# Patient Record
Sex: Female | Born: 1990 | Hispanic: No | Marital: Single | Smoking: Former smoker
Health system: Southern US, Community
[De-identification: ages and names within clinical notes are randomized; demographics above are authoritative.]

## PROBLEM LIST (undated history)

## (undated) DIAGNOSIS — F419 Anxiety disorder, unspecified: Secondary | ICD-10-CM

## (undated) HISTORY — PX: TONSILLECTOMY: SUR1361

---
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
## 2015-10-26 ENCOUNTER — Emergency Department (HOSPITAL_COMMUNITY)
Admission: EM | Admit: 2015-10-26 | Discharge: 2015-10-26 | Disposition: A | Discharge status: Other Acute Inpatient Hospital | Payer: Self-pay

## 2015-10-26 ENCOUNTER — Emergency Department (HOSPITAL_COMMUNITY)
Admission: EM | Admit: 2015-10-26 | Discharge: 2015-10-26 | Disposition: A | Discharge status: Home / Self Care | Payer: BC Managed Care – PPO | Attending: Emergency Medicine | Admitting: Emergency Medicine

## 2015-10-26 ENCOUNTER — Emergency Department (HOSPITAL_COMMUNITY): Payer: BC Managed Care – PPO

## 2015-10-26 ENCOUNTER — Encounter (HOSPITAL_COMMUNITY): Payer: Self-pay | Admitting: Family Medicine

## 2015-10-26 DIAGNOSIS — E663 Overweight: Secondary | ICD-10-CM | POA: Insufficient documentation

## 2015-10-26 DIAGNOSIS — Z880 Allergy status to penicillin: Secondary | ICD-10-CM | POA: Insufficient documentation

## 2015-10-26 DIAGNOSIS — F419 Anxiety disorder, unspecified: Secondary | ICD-10-CM | POA: Insufficient documentation

## 2015-10-26 DIAGNOSIS — Z3202 Encounter for pregnancy test, result negative: Secondary | ICD-10-CM | POA: Insufficient documentation

## 2015-10-26 DIAGNOSIS — R079 Chest pain, unspecified: Secondary | ICD-10-CM | POA: Diagnosis not present

## 2015-10-26 DIAGNOSIS — R0602 Shortness of breath: Secondary | ICD-10-CM | POA: Diagnosis present

## 2015-10-26 LAB — CBC
HCT: 36.5 % (ref 36.0–46.0)
Hemoglobin: 11.6 g/dL — ABNORMAL LOW (ref 12.0–15.0)
MCH: 25.4 pg — AB (ref 26.0–34.0)
MCHC: 31.8 g/dL (ref 30.0–36.0)
MCV: 79.9 fL (ref 78.0–100.0)
PLATELETS: 367 10*3/uL (ref 150–400)
RBC: 4.57 MIL/uL (ref 3.87–5.11)
RDW: 14.9 % (ref 11.5–15.5)
WBC: 12 10*3/uL — ABNORMAL HIGH (ref 4.0–10.5)

## 2015-10-26 LAB — BASIC METABOLIC PANEL
Anion gap: 17 — ABNORMAL HIGH (ref 5–15)
BUN: 8 mg/dL (ref 6–20)
CALCIUM: 9.9 mg/dL (ref 8.9–10.3)
CO2: 21 mmol/L — AB (ref 22–32)
CREATININE: 0.78 mg/dL (ref 0.44–1.00)
Chloride: 103 mmol/L (ref 101–111)
GFR calc Af Amer: >60 mL/min (ref >60)
GFR calc non Af Amer: >60 mL/min (ref >60)
GLUCOSE: 92 mg/dL (ref 65–99)
Potassium: 3.7 mmol/L (ref 3.5–5.1)
Sodium: 141 mmol/L (ref 135–145)

## 2015-10-26 LAB — I-STAT TROPONIN, ED: TROPONIN I, POC: 0 ng/mL (ref 0.00–0.08)

## 2015-10-26 LAB — POC URINE PREG, ED: Preg Test, Ur: NEGATIVE

## 2015-10-26 MED ORDER — KETOROLAC TROMETHAMINE 60 MG/2ML IM SOLN
30.0000 mg | Freq: Once | INTRAMUSCULAR | Status: AC
  Administered 2015-10-26: 30 mg via INTRAMUSCULAR
  Filled 2015-10-26: qty 2

## 2015-10-26 MED ORDER — HYDROXYZINE HCL 25 MG PO TABS: 50.0000 mg | ORAL_TABLET | Freq: Four times a day (QID) | ORAL | Status: AC | PRN

## 2015-10-26 MED ORDER — LORAZEPAM 2 MG/ML IJ SOLN
2.0000 mg | Freq: Once | INTRAMUSCULAR | Status: AC
  Administered 2015-10-26: 2 mg via INTRAMUSCULAR
  Filled 2015-10-26: qty 1

## 2015-10-26 NOTE — Discharge Instructions (Signed)
Please follow with your primary care doctor in the next 2 days for a check-up. They must obtain records for further management.   Do not hesitate to return to the Emergency Department for any new, worsening or concerning symptoms.    Panic Attacks Panic attacks are sudden, short feelings of great fear or discomfort. You may have them for no reason when you are relaxed, when you are uneasy (anxious), or when you are sleeping.  HOME CARE  Take all your medicines as told.  Check with your doctor before starting new medicines.  Keep all doctor visits. GET HELP IF:  You are not able to take your medicines as told.  Your symptoms do not get better.  Your symptoms get worse. GET HELP RIGHT AWAY IF:  Your attacks seem different than your normal attacks.  You have thoughts about hurting yourself or others.  You take panic attack medicine and you have a side effect. MAKE SURE YOU:  Understand these instructions.  Will watch your condition.  Will get help right away if you are not doing well or get worse.   This information is not intended to replace advice given to you by your health care provider. Make sure you discuss any questions you have with your health care provider.   Document Released: 10/11/2010 Document Revised: 06/29/2013 Document Reviewed: 04/22/2013 Elsevier Interactive Patient Education 2016 [employer].   [employer] Guide Outpatient Counseling/Substance Abuse Adult The United Ways 211 is a great source of information about community services available.  Access by dialing 2-1-1 from anywhere in [place], or by website -  PooledIncome.pl.   Other Local Resources (Updated 09/2015)  Crisis Hotlines   Services     Area Served  [employer]  Crisis Hotline, available 24 hours a day, 7 days a week: [phone] [facility], [place]   [facility]  Crisis Hotline, available 24 hours a day, 7 days a week:  252-102-5858 [facility], [place]  [facility]  Suicide Prevention Hotline, available 24 hours a day, 7 days a week: 567-318-7524 [facility], [place]  [employer], available 24 hours a day, 7 days a week: [phone] [facility], [place]   [facility] Access to [employer], available 24 hours a day, 7 days a week: ([phone] All   [facility]  Crisis Hotline, available 24 hours a day, 7 days a week: 726-541-4124 All   Other Local Resources (Updated 09/2015)  Outpatient Counseling/ Substance Abuse Programs  Services     Address and Phone Number  ADS ([facility]cohol and Drug Services)   Options include Individual counseling, group counseling, intensive outpatient program (several hours a day, several days a week)  Offers depression assessments  Provides methadone maintenance program [phone] [address], [address] [place], [place] [address]   [facility]   Offers partial hospitalization/day treatment and DUI/DWI programs  [employer], private insurance [phone] [address], [address] [place], [place] [address]  [facility]    Services include intensive outpatient program (several hours a day, several days a week), outpatient treatment, DUI/DWI services, family education  Also has some services specifically for [employer] transitional housing  [phone] [address] [place], [place] [address]     [facility]  [employer], private pay, and private insurance [phone] [address], [address] [place], [place] [address]  [employer] [facility]  Services include individual counseling, substance abuse intensive outpatient program (several hours a  day, several days a week), day treatment  Delene Loll, Medicaid, private insurance [phone] [address], [address] [place], [place] [address]  [facility]   Offers substance abuse intensive outpatient program (several hours a day, several days a week), partial hospitalization program [phone] [address] [place], [place] [address]  [phone] [address] [place], [place] [address]  [phone] [address] [place], [place] [address]  [phone], [address] [place], [place] [address]  [facility]  Individual counseling only  Accepts private insurance only [phone] [address] [place], [place] [address]  [facility]  Methadone maintenance program [phone] [address] [place], [place] [address]  [facility]  Walk-In Clinic providing substance abuse and mental health counseling  Accepts Medicaid, Medicare, private insurance  Offers sliding scale for uninsured [phone] [address] [place], [place]   [facility] [place], [employer].  Offers individual counseling, and intensive in-home services [phone] [address] [place], [place] [address]  [facility] individual counseling, family counseling, group therapy, domestic violence counseling, consumer credit counseling  Accepts Medicare, Medicaid, private insurance  Offers sliding scale for uninsured [phone] [address] [place], [place] [address]  [phone] [facility], [address] [place], [place] [address]  [facility]  Offers individual, family and group counseling  3 locations - [place], [place], and [place]  [phone]  [address] [place], [place] [address]  [address] [place], [place] [address]  [address] [place], [place] [address]  [facility]    Offers psychiatric assessment, 8-week Intensive Outpatient Program (several hours a day, several times a week, daytime or evenings), early recovery group, family Program, medication management  Private pay or private insurance only  [phone], or  [phone] [address] [place], [place] [address]  [facility] [place] [employer] individual, couples and family counseling  Accepts Medicaid, private insurance, and sliding scale for uninsured [phone] [address] [place], [place] [address]  Len Blalock, MD  Individual counseling  Private insurance [phone] [address] [place], [place] [address]  [facility]   Offers assessment, substance abuse treatment, and behavioral health treatment [phone] [address] [place], [place] [address]  [facility]  Individual counseling  Accepts private insurance [phone] [address] [place], [place] [address]  [facility]  Individual counseling  Accepts Medicare, private insurance [phone] [address] [place], [place] [address]  [facility]    Offers intensive outpatient program (several hours a day, several times a week)  Private pay, private insurance [phone] [facility] [place], [place]  [facility]  Individual counseling  Medicare, private insurance [phone] [address] [place], [place] [address]  [facility]    Offers intensive outpatient program (several hours a day, several times a week) and partial hospitalization program [phone] [address] [place], [place] [address]  Emerson Monte, MD  Individual counseling [phone] [address] [place], [place] [address]  [facility]  Offers Christian counseling to individuals, couples, and families  Accepts Medicare and private insurance; offers sliding scale for uninsured [phone] [address] [place], [place] [address]  [facility]  [place] counseling [phone] [address] [place], [place] [address]  [employer] crisis counseling, individual counseling, group therapy, in-home  therapy, domestic violence services, day treatment, DWI services, Administrator, arts (CST), Assertive Community Treatment Team (ACTT), substance abuse Intensive Outpatient Program (several hours a day, several times a week)  2 locations - [place] [phone] [address] [place], [place] [address]  [phone] [address] [place], [place] [address]  [facility]     Individual counseling and group therapy  [employer], [place], [place] [phone] [address] [place], [place]  [facility]  Offers individual and family counseling  Offers LGBTQ services  Accepts private insurance and private pay [phone] [address] [place], [place] [address]  [facility]    Offers individual counseling, group therapy, and outpatient detox  Accepts private insurance [phone] [address] [place], [place]  [facility]  Individual counseling  Accepts Medicare, private insurance [phone] [address] [place], [place] [address]  [employer]  Individual counseling  Accepts Medicare, private insurance [phone] [address] [place], [place] [address]  [facility]   Offers substance abuse Intensive Outpatient Program (several hours a day, several times a week) [phone], or [phone] [place], [place]

## 2015-10-26 NOTE — ED Notes (Signed)
Pt here for chest pain and SOB. sts hurts to deep breathe and hard to get a breath. sts she woke up this am out of her sleep with it. sts she has a hx of anxiety but she is not under any stress at the moment.

## 2015-10-26 NOTE — ED Provider Notes (Signed)
CSN: [id number]     Arrival date & time 10/26/15  1805 History  By signing my name below, I, Freida Busman, attest that this documentation has been prepared under the direction and in the presence of non-physician practitioner, Wynetta Emery, PA-C. Electronically Signed: Freida Busman, Scribe. 10/26/2015. 8:11 PM.  Chief Complaint  Patient presents with  . Chest Pain  . Shortness of Breath    The history is provided by the patient. No language interpreter was used.   HPI Comments:  Jessica Brandt is a 25 y.o. female  complaining of intermittent sharp central CP which began yesterday after an anxiety attack. Pt notes h/o similar pain after a "bad" anxiety attack but notes the pain doesn't usually last this long. She denies cough, fever, leg swelling, and calf pain. She also denies h/o PE/DVT, use of BCP in the last year, recent long periods of immobilization, cocaine/methamphetamine use, and family h/o cardiac related death less than 61 years of age. Pt is a former smoker; states she smoked for over ten years ~ 1 ppd. No alleviating factors noted. Pt was recently evaluated at [facility] for [facility] symptoms.    History reviewed. No pertinent past medical history. History reviewed. No pertinent past surgical history. History reviewed. No pertinent family history. Social History  Substance Use Topics  . Smoking status: Never Smoker   . Smokeless tobacco: None  . Alcohol Use: None   OB History    No data available     Review of Systems  10 systems reviewed and all are negative for acute change except as noted in the HPI.  Allergies  Penicillins  Home Medications   Prior to Admission medications   Not on File   BP 138/107 mmHg  Pulse 80  Temp(Src) 97.7 F (36.5 C) (Oral)  Resp 16  SpO2 100%  LMP 10/10/2015 Physical Exam  Constitutional: She is oriented to person, place, and time. She appears well-developed and well-nourished. No distress.  overwweight  HENT:   Head: Normocephalic and atraumatic.  Mouth/Throat: Oropharynx is clear and moist.  Eyes: Conjunctivae are normal.  Neck: Normal range of motion. No JVD present. No tracheal deviation present.  Cardiovascular: Normal rate, regular rhythm and intact distal pulses.   Radial pulse equal bilaterally  Pulmonary/Chest: Effort normal and breath sounds normal. No stridor. No respiratory distress. She has no wheezes. She has no rales. She exhibits no tenderness.  Abdominal: Soft. Bowel sounds are normal. She exhibits no distension and no mass. There is no tenderness. There is no rebound and no guarding.  Musculoskeletal: Normal range of motion. She exhibits no edema or tenderness.  No calf asymmetry, superficial collaterals, palpable cords, edema, Homans sign negative bilaterally.    Neurological: She is alert and oriented to person, place, and time.  Skin: Skin is warm and dry. She is not diaphoretic.  Psychiatric: She has a normal mood and affect.  Nursing note and vitals reviewed.   ED Course  Procedures   DIAGNOSTIC STUDIES:  Oxygen Saturation is 100% on RA, normal by my interpretation.    COORDINATION OF CARE:  8:05 PM Will administer ativan in the ED and recommend pt follow up with counselor.  Discussed treatment plan with pt at bedside and pt agreed to plan.  Labs Review Labs Reviewed  BASIC METABOLIC PANEL - Abnormal; Notable for the following:    CO2 21 (*)    Anion gap 17 (*)    All other components within normal limits  CBC -  Abnormal; Notable for the following:    WBC 12.0 (*)    Hemoglobin 11.6 (*)    MCH 25.4 (*)    All other components within normal limits  I-STAT TROPOININ, ED    Imaging Review Dg Chest 2 View  10/26/2015  CLINICAL DATA:  Chest pain, shortness of breath and nausea for 1 day. EXAM: CHEST  2 VIEW COMPARISON:  None. FINDINGS: The cardiomediastinal contours are normal. The lungs are clear. Pulmonary vasculature is normal. No consolidation, pleural  effusion, or pneumothorax. No acute osseous abnormalities are seen. IMPRESSION: No acute pulmonary process. Electronically Signed   By: Rubye Oaks M.D.   On: 10/26/2015 19:03   I have personally reviewed and evaluated these images and lab results as part of my medical decision-making.   EKG Interpretation None      MDM   Final diagnoses:  Anxiety   Filed Vitals:   10/26/15 1820  BP: 138/107  Pulse: 80  Temp: 97.7 F (36.5 C)  TempSrc: Oral  Resp: 16  SpO2: 100%    Medications  LORazepam (ATIVAN) injection 2 mg (2 mg Intramuscular Given 10/26/15 2023)    Jessica Brandt is 25 y.o. female presenting with intermittent chest pain and shortness of breath consistent with prior panic attacks, she states that he normally does not last this long. EKG without arrhythmia, triage initiated blood work with a negative troponin and no anemia. Patient is low risk by Wells criteria, PERC negative. We discussed the pros and cons in obtaining PE testing. In shared decision-making patient declines. Patient given referral for outpatient counseling.  Evaluation does not show pathology that would require ongoing emergent intervention or inpatient treatment. Pt is hemodynamically stable and mentating appropriately. Discussed findings and plan with patient/guardian, who agrees with care plan. All questions answered. Return precautions discussed and outpatient follow up given.   New Prescriptions   HYDROXYZINE (ATARAX/VISTARIL) 25 MG TABLET    Take 2 tablets (50 mg total) by mouth every 6 (six) hours as needed for anxiety.    I personally performed the services described in this documentation, which was scribed in my presence. The recorded information has been reviewed and is accurate.    Wynetta Emery, PA-C 10/26/15 2202  Lyndal Pulley, MD 10/27/15 [phone]

## 2016-01-31 ENCOUNTER — Encounter (HOSPITAL_COMMUNITY): Payer: Self-pay | Admitting: *Deleted

## 2016-01-31 ENCOUNTER — Emergency Department (HOSPITAL_COMMUNITY)
Admission: EM | Admit: 2016-01-31 | Discharge: 2016-01-31 | Disposition: A | Discharge status: Home / Self Care | Payer: BC Managed Care – PPO | Attending: Emergency Medicine | Admitting: Emergency Medicine

## 2016-01-31 DIAGNOSIS — Y92410 Unspecified street and highway as the place of occurrence of the external cause: Secondary | ICD-10-CM | POA: Diagnosis not present

## 2016-01-31 DIAGNOSIS — S0990XA Unspecified injury of head, initial encounter: Secondary | ICD-10-CM | POA: Insufficient documentation

## 2016-01-31 DIAGNOSIS — F419 Anxiety disorder, unspecified: Secondary | ICD-10-CM | POA: Insufficient documentation

## 2016-01-31 DIAGNOSIS — Z87891 Personal history of nicotine dependence: Secondary | ICD-10-CM | POA: Insufficient documentation

## 2016-01-31 DIAGNOSIS — S3992XA Unspecified injury of lower back, initial encounter: Secondary | ICD-10-CM | POA: Diagnosis present

## 2016-01-31 DIAGNOSIS — Y9389 Activity, other specified: Secondary | ICD-10-CM | POA: Diagnosis not present

## 2016-01-31 DIAGNOSIS — S4991XA Unspecified injury of right shoulder and upper arm, initial encounter: Secondary | ICD-10-CM | POA: Diagnosis not present

## 2016-01-31 DIAGNOSIS — Z880 Allergy status to penicillin: Secondary | ICD-10-CM | POA: Insufficient documentation

## 2016-01-31 DIAGNOSIS — Y998 Other external cause status: Secondary | ICD-10-CM | POA: Insufficient documentation

## 2016-01-31 DIAGNOSIS — S4992XA Unspecified injury of left shoulder and upper arm, initial encounter: Secondary | ICD-10-CM | POA: Insufficient documentation

## 2016-01-31 HISTORY — DX: Anxiety disorder, unspecified: F41.9

## 2016-01-31 MED ORDER — METHOCARBAMOL 500 MG PO TABS: 500.0000 mg | ORAL_TABLET | Freq: Two times a day (BID) | ORAL | Status: AC

## 2016-01-31 MED ORDER — IBUPROFEN 800 MG PO TABS: 800.0000 mg | ORAL_TABLET | Freq: Three times a day (TID) | ORAL | Status: AC
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
## 2016-01-31 NOTE — Discharge Instructions (Signed)
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
## 2018-09-22 ENCOUNTER — Emergency Department (HOSPITAL_COMMUNITY)
Admission: EM | Admit: 2018-09-22 | Discharge: 2018-09-22 | Discharge status: Against Medical Advice | Payer: BC Managed Care – PPO | Attending: Emergency Medicine | Admitting: Emergency Medicine

## 2018-09-22 DIAGNOSIS — Z5321 Procedure and treatment not carried out due to patient leaving prior to being seen by health care provider: Secondary | ICD-10-CM | POA: Insufficient documentation

## 2018-09-22 NOTE — ED Triage Notes (Signed)
Pt here with face and arm and leg numbness , pt has been seen by her MD and told her is was stress related

## 2018-09-23 DIAGNOSIS — Z6841 Body Mass Index (BMI) 40.0 and over, adult: Secondary | ICD-10-CM | POA: Diagnosis not present

## 2018-09-23 DIAGNOSIS — Z3009 Encounter for other general counseling and advice on contraception: Secondary | ICD-10-CM | POA: Diagnosis not present

## 2018-09-23 DIAGNOSIS — Z1389 Encounter for screening for other disorder: Secondary | ICD-10-CM | POA: Diagnosis not present

## 2018-09-23 DIAGNOSIS — Z01419 Encounter for gynecological examination (general) (routine) without abnormal findings: Secondary | ICD-10-CM | POA: Diagnosis not present

## 2019-11-29 ENCOUNTER — Emergency Department (HOSPITAL_COMMUNITY): Payer: BC Managed Care – PPO

## 2019-11-29 ENCOUNTER — Encounter (HOSPITAL_COMMUNITY): Payer: Self-pay | Admitting: Emergency Medicine

## 2019-11-29 ENCOUNTER — Emergency Department (HOSPITAL_COMMUNITY)
Admission: EM | Admit: 2019-11-29 | Discharge: 2019-11-29 | Disposition: A | Discharge status: Home / Self Care | Payer: BC Managed Care – PPO | Attending: Emergency Medicine | Admitting: Emergency Medicine

## 2019-11-29 ENCOUNTER — Other Ambulatory Visit: Payer: Self-pay

## 2019-11-29 DIAGNOSIS — R519 Headache, unspecified: Secondary | ICD-10-CM | POA: Diagnosis not present

## 2019-11-29 DIAGNOSIS — Y999 Unspecified external cause status: Secondary | ICD-10-CM | POA: Insufficient documentation

## 2019-11-29 DIAGNOSIS — R200 Anesthesia of skin: Secondary | ICD-10-CM | POA: Diagnosis not present

## 2019-11-29 DIAGNOSIS — R410 Disorientation, unspecified: Secondary | ICD-10-CM | POA: Insufficient documentation

## 2019-11-29 DIAGNOSIS — S299XXA Unspecified injury of thorax, initial encounter: Secondary | ICD-10-CM | POA: Diagnosis not present

## 2019-11-29 DIAGNOSIS — S0990XA Unspecified injury of head, initial encounter: Secondary | ICD-10-CM | POA: Diagnosis not present

## 2019-11-29 DIAGNOSIS — Z79899 Other long term (current) drug therapy: Secondary | ICD-10-CM | POA: Diagnosis not present

## 2019-11-29 DIAGNOSIS — Y92413 State road as the place of occurrence of the external cause: Secondary | ICD-10-CM | POA: Insufficient documentation

## 2019-11-29 DIAGNOSIS — M549 Dorsalgia, unspecified: Secondary | ICD-10-CM | POA: Diagnosis not present

## 2019-11-29 DIAGNOSIS — M542 Cervicalgia: Secondary | ICD-10-CM | POA: Insufficient documentation

## 2019-11-29 DIAGNOSIS — S3992XA Unspecified injury of lower back, initial encounter: Secondary | ICD-10-CM | POA: Diagnosis not present

## 2019-11-29 DIAGNOSIS — R079 Chest pain, unspecified: Secondary | ICD-10-CM | POA: Diagnosis not present

## 2019-11-29 DIAGNOSIS — Z87891 Personal history of nicotine dependence: Secondary | ICD-10-CM | POA: Insufficient documentation

## 2019-11-29 DIAGNOSIS — S199XXA Unspecified injury of neck, initial encounter: Secondary | ICD-10-CM | POA: Diagnosis not present

## 2019-11-29 DIAGNOSIS — Y93I9 Activity, other involving external motion: Secondary | ICD-10-CM | POA: Diagnosis not present

## 2019-11-29 DIAGNOSIS — M545 Low back pain: Secondary | ICD-10-CM | POA: Diagnosis not present

## 2019-11-29 LAB — POC URINE PREG, ED: Preg Test, Ur: NEGATIVE

## 2019-11-29 MED ORDER — CYCLOBENZAPRINE HCL 10 MG PO TABS: 10.0000 mg | ORAL_TABLET | Freq: Two times a day (BID) | ORAL | 0 refills | Status: AC | PRN

## 2019-11-29 MED ORDER — ACETAMINOPHEN 325 MG PO TABS
650.0000 mg | ORAL_TABLET | Freq: Once | ORAL | Status: AC
  Administered 2019-11-29: 650 mg via ORAL
  Filled 2019-11-29: qty 2

## 2019-11-29 MED ORDER — NAPROXEN 500 MG PO TABS: 500.0000 mg | ORAL_TABLET | Freq: Two times a day (BID) | ORAL | 0 refills | Status: AC | PRN

## 2019-11-29 MED ORDER — KETOROLAC TROMETHAMINE 60 MG/2ML IM SOLN
60.0000 mg | Freq: Once | INTRAMUSCULAR | Status: AC
  Administered 2019-11-29: 60 mg via INTRAMUSCULAR
  Filled 2019-11-29: qty 2

## 2019-11-29 NOTE — ED Notes (Signed)
Pt discharge instructions and prescriptions reviewed with the patient. The patient verbalized understanding of both. Pt discharged. 

## 2019-11-29 NOTE — Discharge Instructions (Addendum)
Your work-up today was reassuring.  Please take your medications, as prescribed.  You were given a prescription for Flexeril which is a muscle relaxer.  You should not drive, work, consume alcohol, or operate machinery while taking this medication as it can make you very drowsy.    Please return to the ED or seek medical attention should you experience any new or worsening symptoms.
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
## 2020-02-26 DIAGNOSIS — Z20822 Contact with and (suspected) exposure to covid-19: Secondary | ICD-10-CM | POA: Diagnosis not present

## 2020-02-26 DIAGNOSIS — R6889 Other general symptoms and signs: Secondary | ICD-10-CM | POA: Diagnosis not present

## 2020-10-16 DIAGNOSIS — R300 Dysuria: Secondary | ICD-10-CM | POA: Diagnosis not present

## 2020-10-16 DIAGNOSIS — N3000 Acute cystitis without hematuria: Secondary | ICD-10-CM | POA: Diagnosis not present

## 2021-03-29 DIAGNOSIS — M940 Chondrocostal junction syndrome [Tietze]: Secondary | ICD-10-CM | POA: Diagnosis not present

## 2021-05-14 DIAGNOSIS — Z124 Encounter for screening for malignant neoplasm of cervix: Secondary | ICD-10-CM | POA: Diagnosis not present

## 2021-05-14 DIAGNOSIS — Z01419 Encounter for gynecological examination (general) (routine) without abnormal findings: Secondary | ICD-10-CM | POA: Diagnosis not present

## 2021-05-14 DIAGNOSIS — Z114 Encounter for screening for human immunodeficiency virus [HIV]: Secondary | ICD-10-CM | POA: Diagnosis not present

## 2021-05-14 DIAGNOSIS — N898 Other specified noninflammatory disorders of vagina: Secondary | ICD-10-CM | POA: Diagnosis not present

## 2021-05-14 DIAGNOSIS — Z113 Encounter for screening for infections with a predominantly sexual mode of transmission: Secondary | ICD-10-CM | POA: Diagnosis not present

## 2021-05-14 DIAGNOSIS — Z6841 Body Mass Index (BMI) 40.0 and over, adult: Secondary | ICD-10-CM | POA: Diagnosis not present

## 2021-05-14 DIAGNOSIS — Z118 Encounter for screening for other infectious and parasitic diseases: Secondary | ICD-10-CM | POA: Diagnosis not present

## 2021-05-14 DIAGNOSIS — R635 Abnormal weight gain: Secondary | ICD-10-CM | POA: Diagnosis not present

## 2021-05-14 DIAGNOSIS — Z1159 Encounter for screening for other viral diseases: Secondary | ICD-10-CM | POA: Diagnosis not present

## 2021-05-14 DIAGNOSIS — N760 Acute vaginitis: Secondary | ICD-10-CM | POA: Diagnosis not present
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
